# Patient Record
Sex: Female | Born: 1965 | Race: White | Hispanic: No | Marital: Married | Smoking: Never smoker
Health system: Southern US, Community
[De-identification: ages and names within clinical notes are randomized; demographics above are authoritative.]

## PROBLEM LIST (undated history)

## (undated) DIAGNOSIS — D219 Benign neoplasm of connective and other soft tissue, unspecified: Secondary | ICD-10-CM

## (undated) DIAGNOSIS — I10 Essential (primary) hypertension: Secondary | ICD-10-CM

## (undated) HISTORY — PX: OTHER SURGICAL HISTORY: SHX169

---
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
## 1998-03-09 ENCOUNTER — Inpatient Hospital Stay (HOSPITAL_COMMUNITY): Admission: AD | Admit: 1998-03-09 | Discharge: 1998-03-09 | Payer: Self-pay | Admitting: Obstetrics and Gynecology

## 1998-03-25 ENCOUNTER — Observation Stay (HOSPITAL_COMMUNITY): Admission: AD | Admit: 1998-03-25 | Discharge: 1998-03-28 | Payer: Self-pay | Admitting: Obstetrics and Gynecology

## 1998-05-01 ENCOUNTER — Other Ambulatory Visit: Admission: RE | Admit: 1998-05-01 | Discharge: 1998-05-01 | Payer: Self-pay | Admitting: Obstetrics and Gynecology

## 2000-09-01 ENCOUNTER — Other Ambulatory Visit: Admission: RE | Admit: 2000-09-01 | Discharge: 2000-09-01 | Payer: Self-pay | Admitting: Obstetrics and Gynecology

## 2002-02-28 ENCOUNTER — Other Ambulatory Visit: Admission: RE | Admit: 2002-02-28 | Discharge: 2002-02-28 | Payer: Self-pay | Admitting: Obstetrics and Gynecology

## 2008-08-28 ENCOUNTER — Encounter: Admission: RE | Admit: 2008-08-28 | Discharge: 2008-08-28 | Payer: Self-pay | Admitting: Obstetrics and Gynecology

## 2011-12-29 ENCOUNTER — Other Ambulatory Visit: Payer: Self-pay | Admitting: Obstetrics and Gynecology

## 2011-12-29 DIAGNOSIS — Z1231 Encounter for screening mammogram for malignant neoplasm of breast: Secondary | ICD-10-CM

## 2012-01-03 ENCOUNTER — Ambulatory Visit
Admission: RE | Admit: 2012-01-03 | Discharge: 2012-01-03 | Disposition: A | Discharge status: Home / Self Care | Payer: PRIVATE HEALTH INSURANCE | Source: Ambulatory Visit | Attending: Obstetrics and Gynecology | Admitting: Obstetrics and Gynecology

## 2012-01-03 DIAGNOSIS — Z1231 Encounter for screening mammogram for malignant neoplasm of breast: Secondary | ICD-10-CM

## 2013-07-31 ENCOUNTER — Other Ambulatory Visit: Payer: Self-pay

## 2013-07-31 ENCOUNTER — Ambulatory Visit
Admission: RE | Admit: 2013-07-31 | Discharge: 2013-07-31 | Disposition: A | Discharge status: Home / Self Care | Payer: PRIVATE HEALTH INSURANCE | Source: Ambulatory Visit

## 2013-07-31 ENCOUNTER — Encounter (INDEPENDENT_AMBULATORY_CARE_PROVIDER_SITE_OTHER): Payer: Self-pay

## 2013-07-31 DIAGNOSIS — Z1231 Encounter for screening mammogram for malignant neoplasm of breast: Secondary | ICD-10-CM

## 2014-05-07 ENCOUNTER — Other Ambulatory Visit (HOSPITAL_COMMUNITY): Payer: Self-pay | Admitting: Interventional Radiology

## 2014-05-07 DIAGNOSIS — D259 Leiomyoma of uterus, unspecified: Secondary | ICD-10-CM

## 2014-05-13 ENCOUNTER — Other Ambulatory Visit: Payer: Self-pay | Admitting: Obstetrics and Gynecology

## 2014-05-13 ENCOUNTER — Ambulatory Visit
Admission: RE | Admit: 2014-05-13 | Discharge: 2014-05-13 | Disposition: A | Discharge status: Home / Self Care | Payer: 59 | Source: Ambulatory Visit | Attending: Interventional Radiology | Admitting: Interventional Radiology

## 2014-05-13 DIAGNOSIS — D259 Leiomyoma of uterus, unspecified: Secondary | ICD-10-CM

## 2014-05-13 HISTORY — DX: Essential (primary) hypertension: I10

## 2014-05-13 HISTORY — DX: Benign neoplasm of connective and other soft tissue, unspecified: D21.9

## 2014-05-13 NOTE — Consult Note (Signed)
Chief Complaint: Symptomatic uterine fibroid disease.  Referring Physician(s): Taavon,Richard  History of Present Illness: Natalie Carson is a G48,P3 49 y.o. female with a known history of uterine fibroids since approximately July 2015. Her main complaint is menorrhagia with passage of clots. She usually has 2-3 days of very heavy bleeding. She also complains of bulk symptoms with discomfort of the right pelvis when she lies on her stomach and chronic low back pain. She has some urinary urgency without significant increased urinary frequency. Ultrasound has demonstrated a right-sided dominant fibroid measuring approximately 11 x 10 x 10 cm. She was treated with Lupron injections every month since September which resulted in initially slight decrease in size of the dominant fibroid. However, there has been no significant overall shrinkage in the size of the dominant fibroid to allow less invasive hysterectomy options. She was taken off Lupron in January and recently started spotting on 05/03/2014.  Natalie Carson has had 3 children aged 63, 44 and 45 years old. These were all vaginal deliveries. She has not had prior uterine surgery. She denies any history of gynecologic infections.  No past medical history on file.  Past Surgical History  Procedure Laterality Date  . Tonsillectomy  1975  . Hernia repair  1974    Allergies: Review of patient's allergies indicates no known allergies.  Medications: Losartin      Family History  Father's cousin with history of leiomyosarcoma of uterus.  History   Social History  . Marital Status: Married    Spouse Name: N/A  . Number of Children: N/A  . Years of Education: N/A   Social History Main Topics  . Smoking status: Not on file  . Smokeless tobacco: Not on file  . Alcohol Use: Not on file  . Drug Use: Not on file  . Sexual Activity: Not on file   Other Topics Concern  . Not on file   Social History Narrative  . No narrative  on file   Tthe patient works full time at [employer] in the [employer].   Review of Systems: A 12 point ROS discussed and pertinent positives are indicated in the HPI above.  All other systems are negative.  Review of Systems  Constitutional: Negative.   Respiratory: Negative.   Cardiovascular: Negative.   Gastrointestinal: Positive for abdominal distention. Negative for nausea, vomiting, abdominal pain, diarrhea, constipation, blood in stool and anal bleeding.  Genitourinary: Positive for urgency, menstrual problem and pelvic pain. Negative for dysuria, frequency, hematuria, flank pain, decreased urine volume, vaginal discharge, enuresis, difficulty urinating and dyspareunia.  Musculoskeletal: Positive for back pain. Negative for myalgias, joint swelling, arthralgias, gait problem and neck pain.  Neurological: Negative.      Vital Signs: BP 140/92 mmHg  Pulse 81  Ht 5' 4"  (1.626 m)  Wt 195 lb (88.451 kg)  BMI 33.46 kg/m2  LMP 05/03/2014  Physical Exam  Constitutional: She is oriented to person, place, and time. She appears well-developed and well-nourished. No distress.  HENT:  Head: Normocephalic.  Neck: Neck supple. No JVD present.  Cardiovascular: Normal rate, regular rhythm and normal heart sounds.  Exam reveals no friction rub.   No murmur heard. Pulmonary/Chest: Effort normal and breath sounds normal. No respiratory distress. She has no wheezes. She has no rales.  Abdominal: Soft. Bowel sounds are normal. There is no tenderness.  Palpable enlarged uterus on transabdominal exam extending up to the level of the umbilicus. Palpable portion of the uterus and/or dominant palpable  fibroid is to the right of midline.  Musculoskeletal: She exhibits no edema or tenderness.  Lymphadenopathy:    She has no cervical adenopathy.  Neurological: She is alert and oriented to person, place, and time.  Skin: She is not diaphoretic.  Nursing note and vitals  reviewed.   Imaging: Other than the dominant 11 cm subserosal fibroid noted of the right uterus, no other apparent fibroids by office ultrasound. The ovaries were poorly visualized bilaterally.  Labs:  CBC: WBC 3.1, hemoglobin 11.1, hematocrit 35 and platelet count 244 in 2015.  COAGS: No results for input(s): INR, APTT in the last 8760 hours.  BMP: BUN 10, creatinine 1.05 and estimated GFR 64 mL per minute. Sodium 139, potassium 4.4.  LIVER FUNCTION TESTS: Total protein 6.4, albumin 4.0, total bilirubin less than 0.2, alkaline phosphatase 51, AST 12 and ALT 10  Pap smear negative for malignancy. Squamous metaplasia present.   Assessment and Plan:  I met with Natalie Carson and discussed treatment options with her regarding her symptomatic uterine fibroid disease. Based on ultrasound, there appears to be one single dominant subserosal fibroid. There may be other smaller fibroids that may not be detectable by ultrasound. The benefits of hysterectomy versus uterine fibroid embolization were discussed. Technical details of fibroid embolization were reviewed including risks. The patient was also given a pamphlet for additional information. There is an indication to treat based on menorrhagia and bulk symptoms related to uterine enlargement.  In order to determine if Natalie Carson is a candidate for fibroid embolization, MRI of the pelvis will be necessary to evaluate fibroid morphology and enhancement pattern. This will also allow evaluation of the ovaries and adnexal regions which were not well-visualized by ultrasound.  Natalie Carson is agreeable to undergo MRI of the pelvis. She would like to review images with me after the procedure is performed and interpreted. I will meet back with her once the MRI is obtained. If she is a candidate for embolization, Natalie Carson is interested in pursuing embolization to reduce the amount of time off she will have to take from work. She did have some questions  regarding the potential for malignant transformation of a fibroid due to her family history of uterine leiomyosarcoma. I told the patient that this is very rare and that MRI evaluation should help in determining if the dominant fibroid and/or other fibroids have a typical appearance and enhancement pattern.  Thank you for this interesting consult.  I greatly enjoyed meeting Natalie Carson and look forward to participating in their care.  SignedAletta Edouard T 05/13/2014, 2:01 PM     I spent a total of 40 minutes face to face in clinical consultation, greater than 50% of which was counseling/coordinating care for management of symptomatic uterine fibroids.

## 2014-05-27 ENCOUNTER — Ambulatory Visit
Admission: RE | Admit: 2014-05-27 | Discharge: 2014-05-27 | Disposition: A | Discharge status: Home / Self Care | Payer: 59 | Source: Ambulatory Visit | Attending: Obstetrics and Gynecology | Admitting: Obstetrics and Gynecology

## 2014-05-27 DIAGNOSIS — D259 Leiomyoma of uterus, unspecified: Secondary | ICD-10-CM

## 2014-05-27 MED ORDER — GADOBENATE DIMEGLUMINE 529 MG/ML IV SOLN
18.0000 mL | Freq: Once | INTRAVENOUS | Status: AC | PRN
  Administered 2014-05-27: 18 mL via INTRAVENOUS

## 2014-06-03 ENCOUNTER — Other Ambulatory Visit (HOSPITAL_COMMUNITY): Payer: Self-pay | Admitting: Interventional Radiology

## 2014-06-03 DIAGNOSIS — D259 Leiomyoma of uterus, unspecified: Secondary | ICD-10-CM

## 2014-06-10 ENCOUNTER — Other Ambulatory Visit: Payer: 59

## 2014-06-26 ENCOUNTER — Ambulatory Visit
Admission: RE | Admit: 2014-06-26 | Discharge: 2014-06-26 | Disposition: A | Discharge status: Home / Self Care | Payer: 59 | Source: Ambulatory Visit | Attending: Interventional Radiology | Admitting: Interventional Radiology

## 2014-06-26 DIAGNOSIS — D259 Leiomyoma of uterus, unspecified: Secondary | ICD-10-CM

## 2014-07-01 NOTE — Progress Notes (Signed)
   Chief Complaint: Systematic uterine fibroid disease.  History of Present Illness: Natalie Carson is a 49 y.o. female seen on 05/13/2014 for evaluation for possible uterine fibroid embolization. The patient returns for discussion after MRI of the pelvis was performed on 05/27/2014.  Past Medical History  Diagnosis Date  . Hypertension   . Fibroids     Past Surgical History  Procedure Laterality Date  . Tonsillectomy  1975  . Hernia repair  1974    Allergies: Review of patient's allergies indicates no known allergies.  Medications: Prior to Admission medications   Medication Sig Start Date End Date Taking? Authorizing Provider  losartan (COZAAR) 100 MG tablet Take 100 mg by mouth daily.   Yes Historical Provider, MD    No family history on file.  History   Social History  . Marital Status: Married    Spouse Name: N/A  . Number of Children: N/A  . Years of Education: N/A   Social History Main Topics  . Smoking status: Not on file  . Smokeless tobacco: Not on file  . Alcohol Use: Not on file  . Drug Use: Not on file  . Sexual Activity: Not on file   Other Topics Concern  . Not on file   Social History Narrative  . No narrative on file    Review of Systems  Vital Signs: BP 145/80 mmHg  Pulse 97  Temp(Src) 98.6 F (37 C)  Resp 14  SpO2 96%  LMP 06/18/2014  Physical Exam  Imaging: No results found.  Assessment and Plan:  I met with Natalie Carson. We reviewed the MRI of the pelvis performed on 05/27/2014. This shows a large dominant fundal uterine mass measuring 11 x 10 x 13 cm. This fibroid is markedly heterogeneous in signal intensity on precontrast imaging and shows avid enhancement after administration of gadolinium. The mass obscures the endometrium.  Given the lack of shrinkage of this dominant mass with Lupron therapy and MRI characteristics, I told Natalie Carson that I am somewhat concerned that this could represent a malignant fibroid/sarcoma.    I recommended that Natalie Carson meet back with Dr. Ronita Hipps to discuss hysterectomy. Rather than perform embolization now that might ultimately result in hysterectomy for enlarging an enlarging mass, definitive hysterectomy may be the more appropriate procedure. Natalie Carson plans to make an appointment to talk to Dr. Ronita Hipps.   SignedAletta Edouard T 07/01/2014, 9:11 AM     I spent a total of 15 minutes face to face strictly in a counseling role for this visit.

## 2014-07-03 ENCOUNTER — Other Ambulatory Visit: Payer: 59

## 2014-07-04 ENCOUNTER — Other Ambulatory Visit: Payer: 59

## 2014-08-25 ENCOUNTER — Other Ambulatory Visit: Payer: Self-pay | Admitting: Obstetrics and Gynecology

## 2014-08-26 NOTE — Anesthesia Preprocedure Evaluation (Addendum)
Anesthesia Evaluation  Patient identified by MRN, date of birth, ID band Patient awake    Reviewed: Allergy & Precautions, NPO status , Patient's Chart, lab work & pertinent test results, reviewed documented beta blocker date and time   Airway Mallampati: II   Neck ROM: Full    Dental  (+) Teeth Intact, Dental Advisory Given   Pulmonary neg pulmonary ROS,  breath sounds clear to auscultation        Cardiovascular hypertension, Pt. on medications Rhythm:Regular     Neuro/Psych negative neurological ROS  negative psych ROS   GI/Hepatic negative GI ROS, Neg liver ROS,   Endo/Other  negative endocrine ROS  Renal/GU negative Renal ROS     Musculoskeletal   Abdominal (+)  Abdomen: soft.    Peds  Hematology 12/37   Anesthesia Other Findings   Reproductive/Obstetrics                         Anesthesia Physical Anesthesia Plan  ASA: II  Anesthesia Plan: General   Post-op Pain Management:    Induction: Intravenous  Airway Management Planned: Oral ETT  Additional Equipment:   Intra-op Plan:   Post-operative Plan: Extubation in OR  Informed Consent: I have reviewed the patients History and Physical, chart, labs and discussed the procedure including the risks, benefits and alternatives for the proposed anesthesia with the patient or authorized representative who has indicated his/her understanding and acceptance.     Plan Discussed with:   Anesthesia Plan Comments: (Check am labs, multimodal pain rx)       Anesthesia Quick Evaluation

## 2014-08-28 ENCOUNTER — Other Ambulatory Visit: Payer: Self-pay

## 2014-08-28 ENCOUNTER — Encounter (HOSPITAL_COMMUNITY)
Admission: RE | Admit: 2014-08-28 | Discharge: 2014-08-28 | Disposition: A | Discharge status: Home / Self Care | Payer: 59 | Source: Ambulatory Visit | Attending: Obstetrics and Gynecology | Admitting: Obstetrics and Gynecology

## 2014-08-28 ENCOUNTER — Encounter (HOSPITAL_COMMUNITY): Payer: Self-pay

## 2014-08-28 DIAGNOSIS — Z01818 Encounter for other preprocedural examination: Secondary | ICD-10-CM | POA: Diagnosis not present

## 2014-08-28 DIAGNOSIS — D259 Leiomyoma of uterus, unspecified: Secondary | ICD-10-CM | POA: Diagnosis not present

## 2014-08-28 LAB — BASIC METABOLIC PANEL
ANION GAP: 4 — AB (ref 5–15)
BUN: 11 mg/dL (ref 6–20)
CHLORIDE: 106 mmol/L (ref 101–111)
CO2: 27 mmol/L (ref 22–32)
CREATININE: 1.04 mg/dL — AB (ref 0.44–1.00)
Calcium: 9 mg/dL (ref 8.9–10.3)
GFR calc Af Amer: >60 mL/min (ref >60)
GFR calc non Af Amer: >60 mL/min (ref >60)
Glucose, Bld: 94 mg/dL (ref 65–99)
Potassium: 4.5 mmol/L (ref 3.5–5.1)
Sodium: 137 mmol/L (ref 135–145)

## 2014-08-28 LAB — CBC
HEMATOCRIT: 37.7 % (ref 36.0–46.0)
HEMOGLOBIN: 12.4 g/dL (ref 12.0–15.0)
MCH: 29.4 pg (ref 26.0–34.0)
MCHC: 32.9 g/dL (ref 30.0–36.0)
MCV: 89.3 fL (ref 78.0–100.0)
PLATELETS: 215 10*3/uL (ref 150–400)
RBC: 4.22 MIL/uL (ref 3.87–5.11)
RDW: 13 % (ref 11.5–15.5)
WBC: 3.7 10*3/uL — ABNORMAL LOW (ref 4.0–10.5)

## 2014-08-28 LAB — TYPE AND SCREEN
ABO/RH(D): AB POS
ANTIBODY SCREEN: NEGATIVE

## 2014-08-28 LAB — ABO/RH: ABO/RH(D): AB POS

## 2014-08-28 NOTE — Patient Instructions (Addendum)
   Your procedure is scheduled on: July 25 at [facility] through the [place] [facility] at: [facility] up the phone at the desk and dial [phone] and inform us of your arrival.  Please call this number if you have any problems the morning of surgery: [phone]  Remember: Do not eat food after midnight: July 24 (sunday) Do not drink clear liquids after: 9am July 25 day of surgery Take these medicines the morning of surgery with a SIP OF WATER:  COZAAR DAY OF SURGERY  Do not wear jewelry, make-up, No metal in your hair or on your body. Do not wear lotions, powders, perfumes.  You may wear deodorant.  Do not bring valuables to the hospital. Contacts, dentures or bridgework may not be worn into surgery.  Leave suitcase in the car. After Surgery it may be brought to your room. For patients being admitted to the hospital, checkout time is 11:00am the day of discharge.

## 2014-08-31 NOTE — H&P (Signed)
Natalie Carson, Natalie Carson             ACCOUNT NO.:  [id number]  MEDICAL RECORD NO.:  [id number]  LOCATION:  [facility]                         FACILITY:  [facility]  PHYSICIAN:  Lovenia Kim, M.D.DATE OF BIRTH:  01-30-1966  DATE OF ADMISSION:  07/29/2014 DATE OF DISCHARGE:                             HISTORY & PHYSICAL   PREOPERATIVE DIAGNOSES:  Enlarging uterine fibroid with symptomatic pain and history of secondary anemia.  HISTORY OF PRESENT ILLNESS:  She is a 49 year old Caucasian female, G4, P3, with a history of enlarging fibroids for definitive therapy.  She had Lupron with no shrinkage noted.  She was sent by [facility] to Interventional Radiology for consideration of embolization.  Dr. Kathlene Cote decided again due to the enlarging fibroid and questionable concerns for adenosarcoma.  She had a negative endometrial biopsy.  She had normal LDH isoenzymes.  FAMILY HISTORY:  Lung cancer, ovarian cancer, thyroid cancer, diabetes, hypertension, and breast cancer.  SOCIAL HISTORY:  She is a nonsmoker, nondrinker.  She denies domestic or physical violence.  She has a history of vaginal delivery x3, history of cone biopsy in 1994, tonsillectomy and hernia repair.  PHYSICAL EXAMINATION:  GENERAL:  She is a well-developed, well-nourished white female, in no acute distress. HEENT:  Normal. NECK:  Supple.  Full range of motion. LUNGS:  Clear. HEART:  Regular rate and rhythm. ABDOMEN:  Soft, nontender. PELVIC:  Reveals some markedly enlarged uterus to about 16-week size. No adnexal masses were appreciated. EXTREMITIES:  There are no cords. NEUROLOGIC:  Nonfocal. SKIN:  Intact.  IMPRESSION:  Enlarging uterine fibroids refractory to Lupron, not  a candidate for Interventional Radiology embolization.  PLAN:  Due to concerns regarding sarcoma despite negative workup, we will proceed with total abdominal hysterectomy/bilateral salpingectomy instead of a minimally invasive approach. Risks of  anesthesia, infection, bleeding, injury to surrounding organs with possible need for repair was discussed, delayed versus immediate complications including bowel and bladder injury with possible need for repair noted. Inability to predict sarcoma despite negative workup discussed.     Lovenia Kim, M.D.     RJT/MEDQ  D:  08/31/2014  T:  08/31/2014  Job:  [id number]

## 2014-09-01 ENCOUNTER — Inpatient Hospital Stay (HOSPITAL_COMMUNITY)
Admission: RE | Admit: 2014-09-01 | Discharge: 2014-09-03 | DRG: 743 | Disposition: A | Discharge status: Home / Self Care | Payer: 59 | Source: Ambulatory Visit | Attending: Obstetrics and Gynecology | Admitting: Obstetrics and Gynecology

## 2014-09-01 ENCOUNTER — Inpatient Hospital Stay (HOSPITAL_COMMUNITY): Payer: 59 | Admitting: Anesthesiology

## 2014-09-01 ENCOUNTER — Encounter (HOSPITAL_COMMUNITY)
Admission: RE | Disposition: A | Discharge status: Home / Self Care | Payer: Self-pay | Source: Ambulatory Visit | Attending: Obstetrics and Gynecology

## 2014-09-01 ENCOUNTER — Encounter (HOSPITAL_COMMUNITY): Payer: Self-pay | Admitting: *Deleted

## 2014-09-01 DIAGNOSIS — D649 Anemia, unspecified: Secondary | ICD-10-CM | POA: Diagnosis present

## 2014-09-01 DIAGNOSIS — N831 Corpus luteum cyst: Secondary | ICD-10-CM | POA: Diagnosis present

## 2014-09-01 DIAGNOSIS — Z833 Family history of diabetes mellitus: Secondary | ICD-10-CM | POA: Diagnosis not present

## 2014-09-01 DIAGNOSIS — N921 Excessive and frequent menstruation with irregular cycle: Secondary | ICD-10-CM | POA: Diagnosis present

## 2014-09-01 DIAGNOSIS — Z801 Family history of malignant neoplasm of trachea, bronchus and lung: Secondary | ICD-10-CM

## 2014-09-01 DIAGNOSIS — Z8041 Family history of malignant neoplasm of ovary: Secondary | ICD-10-CM

## 2014-09-01 DIAGNOSIS — N736 Female pelvic peritoneal adhesions (postinfective): Secondary | ICD-10-CM | POA: Diagnosis present

## 2014-09-01 DIAGNOSIS — Z8249 Family history of ischemic heart disease and other diseases of the circulatory system: Secondary | ICD-10-CM | POA: Diagnosis not present

## 2014-09-01 DIAGNOSIS — Z803 Family history of malignant neoplasm of breast: Secondary | ICD-10-CM

## 2014-09-01 DIAGNOSIS — N838 Other noninflammatory disorders of ovary, fallopian tube and broad ligament: Secondary | ICD-10-CM | POA: Diagnosis present

## 2014-09-01 DIAGNOSIS — N946 Dysmenorrhea, unspecified: Secondary | ICD-10-CM | POA: Diagnosis present

## 2014-09-01 DIAGNOSIS — D259 Leiomyoma of uterus, unspecified: Principal | ICD-10-CM | POA: Diagnosis present

## 2014-09-01 DIAGNOSIS — I10 Essential (primary) hypertension: Secondary | ICD-10-CM | POA: Diagnosis present

## 2014-09-01 DIAGNOSIS — D219 Benign neoplasm of connective and other soft tissue, unspecified: Secondary | ICD-10-CM | POA: Diagnosis present

## 2014-09-01 DIAGNOSIS — N830 Follicular cyst of ovary: Secondary | ICD-10-CM | POA: Diagnosis present

## 2014-09-01 HISTORY — PX: BILATERAL SALPINGECTOMY: SHX5743

## 2014-09-01 HISTORY — PX: ABDOMINAL HYSTERECTOMY: SHX81

## 2014-09-01 SURGERY — HYSTERECTOMY, ABDOMINAL
Anesthesia: General | Site: Abdomen

## 2014-09-01 MED ORDER — PROMETHAZINE HCL 25 MG/ML IJ SOLN
6.2500 mg | INTRAMUSCULAR | Status: DC | PRN
Start: 2014-09-01 — End: 2014-09-01

## 2014-09-01 MED ORDER — PHENYLEPHRINE 40 MCG/ML (10ML) SYRINGE FOR IV PUSH (FOR BLOOD PRESSURE SUPPORT)
PREFILLED_SYRINGE | INTRAVENOUS | Status: AC
  Filled 2014-09-01: qty 10

## 2014-09-01 MED ORDER — MIDAZOLAM HCL 2 MG/2ML IJ SOLN
INTRAMUSCULAR | Status: DC | PRN
Start: 2014-09-01 — End: 2014-09-01
  Administered 2014-09-01: 2 mg via INTRAVENOUS

## 2014-09-01 MED ORDER — HYDROMORPHONE 0.3 MG/ML IV SOLN
INTRAVENOUS | Status: DC
  Administered 2014-09-01: 18:00:00 via INTRAVENOUS
  Administered 2014-09-01: 3.3 mg via INTRAVENOUS
  Administered 2014-09-02 (×3): 1.2 mg via INTRAVENOUS
  Filled 2014-09-01: qty 25

## 2014-09-01 MED ORDER — BUPIVACAINE HCL (PF) 0.25 % IJ SOLN
INTRAMUSCULAR | Status: AC
  Filled 2014-09-01: qty 10

## 2014-09-01 MED ORDER — ONDANSETRON HCL 4 MG/2ML IJ SOLN
INTRAMUSCULAR | Status: DC | PRN
  Administered 2014-09-01: 4 mg via INTRAVENOUS

## 2014-09-01 MED ORDER — PHENYLEPHRINE HCL 10 MG/ML IJ SOLN
INTRAMUSCULAR | Status: DC | PRN
  Administered 2014-09-01 (×2): 40 ug via INTRAVENOUS

## 2014-09-01 MED ORDER — DEXAMETHASONE SODIUM PHOSPHATE 10 MG/ML IJ SOLN
INTRAMUSCULAR | Status: DC | PRN
  Administered 2014-09-01: 8 mg via INTRAVENOUS

## 2014-09-01 MED ORDER — OXYCODONE-ACETAMINOPHEN 5-325 MG PO TABS
1.0000 | ORAL_TABLET | ORAL | Status: DC | PRN
  Administered 2014-09-02: 1 via ORAL
  Administered 2014-09-02 – 2014-09-03 (×8): 2 via ORAL
  Filled 2014-09-01 (×2): qty 1
  Filled 2014-09-01 (×4): qty 2
  Filled 2014-09-01: qty 1
  Filled 2014-09-01 (×3): qty 2

## 2014-09-01 MED ORDER — DEXAMETHASONE SODIUM PHOSPHATE 10 MG/ML IJ SOLN
INTRAMUSCULAR | Status: AC
  Filled 2014-09-01: qty 1

## 2014-09-01 MED ORDER — HYDROMORPHONE HCL 1 MG/ML IJ SOLN
0.2500 mg | INTRAMUSCULAR | Status: DC | PRN
  Administered 2014-09-01: 0.25 mg via INTRAVENOUS
  Administered 2014-09-01: 0.5 mg via INTRAVENOUS
  Administered 2014-09-01: 0.25 mg via INTRAVENOUS

## 2014-09-01 MED ORDER — LIDOCAINE HCL (CARDIAC) 20 MG/ML IV SOLN
INTRAVENOUS | Status: DC | PRN
  Administered 2014-09-01: 100 mg via INTRAVENOUS

## 2014-09-01 MED ORDER — DIPHENHYDRAMINE HCL 50 MG/ML IJ SOLN: 12.5000 mg | Freq: Four times a day (QID) | INTRAMUSCULAR | Status: DC | PRN

## 2014-09-01 MED ORDER — PROPOFOL 10 MG/ML IV BOLUS
INTRAVENOUS | Status: AC
  Filled 2014-09-01: qty 20

## 2014-09-01 MED ORDER — FENTANYL CITRATE (PF) 250 MCG/5ML IJ SOLN
INTRAMUSCULAR | Status: AC
  Filled 2014-09-01: qty 5

## 2014-09-01 MED ORDER — GLYCOPYRROLATE 0.2 MG/ML IJ SOLN
INTRAMUSCULAR | Status: DC | PRN
  Administered 2014-09-01: .1 mg via INTRAVENOUS
  Administered 2014-09-01: .6 mg via INTRAVENOUS

## 2014-09-01 MED ORDER — KETOROLAC TROMETHAMINE 30 MG/ML IJ SOLN
INTRAMUSCULAR | Status: AC
  Filled 2014-09-01: qty 1

## 2014-09-01 MED ORDER — BUPIVACAINE HCL (PF) 0.25 % IJ SOLN
INTRAMUSCULAR | Status: DC | PRN
  Administered 2014-09-01: 10 mL

## 2014-09-01 MED ORDER — PROPOFOL 10 MG/ML IV BOLUS
INTRAVENOUS | Status: DC | PRN
  Administered 2014-09-01: 200 mg via INTRAVENOUS

## 2014-09-01 MED ORDER — LIDOCAINE HCL (CARDIAC) 20 MG/ML IV SOLN
INTRAVENOUS | Status: AC
  Filled 2014-09-01: qty 5

## 2014-09-01 MED ORDER — TRAMADOL HCL 50 MG PO TABS: 50.0000 mg | ORAL_TABLET | Freq: Four times a day (QID) | ORAL | Status: DC | PRN

## 2014-09-01 MED ORDER — NEOSTIGMINE METHYLSULFATE 10 MG/10ML IV SOLN
INTRAVENOUS | Status: AC
  Filled 2014-09-01: qty 1

## 2014-09-01 MED ORDER — DEXTROSE IN LACTATED RINGERS 5 % IV SOLN
INTRAVENOUS | Status: DC
  Administered 2014-09-01 – 2014-09-02 (×2): via INTRAVENOUS

## 2014-09-01 MED ORDER — PROMETHAZINE HCL 25 MG/ML IJ SOLN: 6.2500 mg | INTRAMUSCULAR | Status: DC | PRN

## 2014-09-01 MED ORDER — SODIUM CHLORIDE 0.9 % IJ SOLN: 9.0000 mL | INTRAMUSCULAR | Status: DC | PRN

## 2014-09-01 MED ORDER — KETOROLAC TROMETHAMINE 30 MG/ML IJ SOLN
INTRAMUSCULAR | Status: DC | PRN
  Administered 2014-09-01: 30 mg via INTRAVENOUS

## 2014-09-01 MED ORDER — HYDROMORPHONE HCL 1 MG/ML IJ SOLN
INTRAMUSCULAR | Status: AC
  Filled 2014-09-01: qty 1

## 2014-09-01 MED ORDER — MEPERIDINE HCL 25 MG/ML IJ SOLN: 6.2500 mg | INTRAMUSCULAR | Status: DC | PRN

## 2014-09-01 MED ORDER — EPHEDRINE 5 MG/ML INJ
INTRAVENOUS | Status: AC
  Filled 2014-09-01: qty 10

## 2014-09-01 MED ORDER — DIPHENHYDRAMINE HCL 12.5 MG/5ML PO ELIX: 12.5000 mg | ORAL_SOLUTION | Freq: Four times a day (QID) | ORAL | Status: DC | PRN

## 2014-09-01 MED ORDER — FENTANYL CITRATE (PF) 100 MCG/2ML IJ SOLN
INTRAMUSCULAR | Status: DC | PRN
  Administered 2014-09-01 (×9): 50 ug via INTRAVENOUS

## 2014-09-01 MED ORDER — CEFAZOLIN SODIUM-DEXTROSE 2-3 GM-% IV SOLR
2.0000 g | INTRAVENOUS | Status: AC
  Administered 2014-09-01: 2 g via INTRAVENOUS

## 2014-09-01 MED ORDER — LOSARTAN POTASSIUM 50 MG PO TABS
100.0000 mg | ORAL_TABLET | Freq: Every day | ORAL | Status: DC
Start: 2014-09-02 — End: 2014-09-03
  Administered 2014-09-02 – 2014-09-03 (×2): 100 mg via ORAL
  Filled 2014-09-01 (×3): qty 2

## 2014-09-01 MED ORDER — BUPIVACAINE LIPOSOME 1.3 % IJ SUSP
20.0000 mL | Freq: Once | INTRAMUSCULAR | Status: AC
  Administered 2014-09-01: 20 mL
  Filled 2014-09-01: qty 20

## 2014-09-01 MED ORDER — MIDAZOLAM HCL 2 MG/2ML IJ SOLN
INTRAMUSCULAR | Status: AC
  Filled 2014-09-01: qty 2

## 2014-09-01 MED ORDER — LACTATED RINGERS IV SOLN
INTRAVENOUS | Status: DC
  Administered 2014-09-01 (×3): via INTRAVENOUS

## 2014-09-01 MED ORDER — ONDANSETRON HCL 4 MG/2ML IJ SOLN: 4.0000 mg | Freq: Four times a day (QID) | INTRAMUSCULAR | Status: DC | PRN

## 2014-09-01 MED ORDER — GLYCOPYRROLATE 0.2 MG/ML IJ SOLN
INTRAMUSCULAR | Status: AC
  Filled 2014-09-01: qty 1

## 2014-09-01 MED ORDER — GLYCOPYRROLATE 0.2 MG/ML IJ SOLN
INTRAMUSCULAR | Status: AC
  Filled 2014-09-01: qty 3

## 2014-09-01 MED ORDER — NALOXONE HCL 0.4 MG/ML IJ SOLN: 0.4000 mg | INTRAMUSCULAR | Status: DC | PRN

## 2014-09-01 MED ORDER — ONDANSETRON HCL 4 MG/2ML IJ SOLN
INTRAMUSCULAR | Status: AC
  Filled 2014-09-01: qty 2

## 2014-09-01 MED ORDER — NEOSTIGMINE METHYLSULFATE 10 MG/10ML IV SOLN
INTRAVENOUS | Status: DC | PRN
  Administered 2014-09-01: 3 mg via INTRAVENOUS

## 2014-09-01 MED ORDER — ROCURONIUM BROMIDE 100 MG/10ML IV SOLN
INTRAVENOUS | Status: DC | PRN
  Administered 2014-09-01: 5 mg via INTRAVENOUS
  Administered 2014-09-01: 10 mg via INTRAVENOUS
  Administered 2014-09-01: 30 mg via INTRAVENOUS
  Administered 2014-09-01: 10 mg via INTRAVENOUS
  Administered 2014-09-01: 5 mg via INTRAVENOUS

## 2014-09-01 MED ORDER — 0.9 % SODIUM CHLORIDE (POUR BTL) OPTIME
TOPICAL | Status: DC | PRN
  Administered 2014-09-01: 2000 mL
  Administered 2014-09-01: 1000 mL

## 2014-09-01 MED ORDER — SCOPOLAMINE 1 MG/3DAYS TD PT72
MEDICATED_PATCH | TRANSDERMAL | Status: AC
  Administered 2014-09-01: 1.5 mg via TRANSDERMAL
  Filled 2014-09-01: qty 1

## 2014-09-01 MED ORDER — SCOPOLAMINE 1 MG/3DAYS TD PT72
1.0000 | MEDICATED_PATCH | Freq: Once | TRANSDERMAL | Status: DC
  Administered 2014-09-01: 1.5 mg via TRANSDERMAL

## 2014-09-01 MED ORDER — ROCURONIUM BROMIDE 100 MG/10ML IV SOLN
INTRAVENOUS | Status: AC
  Filled 2014-09-01: qty 1

## 2014-09-01 MED ORDER — EPHEDRINE SULFATE 50 MG/ML IJ SOLN
INTRAMUSCULAR | Status: DC | PRN
  Administered 2014-09-01 (×2): 5 mg via INTRAVENOUS

## 2014-09-01 MED ORDER — CEFAZOLIN SODIUM-DEXTROSE 2-3 GM-% IV SOLR
INTRAVENOUS | Status: AC
  Filled 2014-09-01: qty 50

## 2014-09-01 SURGICAL SUPPLY — 38 items
BENZOIN TINCTURE PRP APPL 2/3 (GAUZE/BANDAGES/DRESSINGS) ×3 IMPLANT
CANISTER SUCT 3000ML (MISCELLANEOUS) ×3 IMPLANT
CLOTH BEACON ORANGE TIMEOUT ST (SAFETY) ×3 IMPLANT
CONT PATH 16OZ SNAP LID 3702 (MISCELLANEOUS) ×3 IMPLANT
DRAPE WARM FLUID 44X44 (DRAPE) ×3 IMPLANT
DRSG OPSITE POSTOP 4X10 (GAUZE/BANDAGES/DRESSINGS) ×3 IMPLANT
DURAPREP 26ML APPLICATOR (WOUND CARE) ×3 IMPLANT
ELECT LIGASURE SHORT 9 REUSE (ELECTRODE) ×3 IMPLANT
GAUZE SPONGE 4X4 16PLY XRAY LF (GAUZE/BANDAGES/DRESSINGS) ×3 IMPLANT
GLOVE BIO SURGEON STRL SZ7.5 (GLOVE) ×3 IMPLANT
GLOVE BIOGEL PI IND STRL 7.0 (GLOVE) ×4 IMPLANT
GLOVE BIOGEL PI INDICATOR 7.0 (GLOVE) ×2
GOWN STRL REUS W/TWL LRG LVL3 (GOWN DISPOSABLE) ×3 IMPLANT
NEEDLE HYPO 22GX1.5 SAFETY (NEEDLE) ×3 IMPLANT
NS IRRIG 1000ML POUR BTL (IV SOLUTION) ×3 IMPLANT
PACK ABDOMINAL GYN (CUSTOM PROCEDURE TRAY) ×3 IMPLANT
PAD OB MATERNITY 4.3X12.25 (PERSONAL CARE ITEMS) ×3 IMPLANT
PROTECTOR NERVE ULNAR (MISCELLANEOUS) ×3 IMPLANT
SPONGE LAP 18X18 X RAY DECT (DISPOSABLE) ×6 IMPLANT
STAPLER VISISTAT 35W (STAPLE) ×3 IMPLANT
STRIP CLOSURE SKIN 1/2X4 (GAUZE/BANDAGES/DRESSINGS) ×3 IMPLANT
SUT MNCRL AB 3-0 PS2 27 (SUTURE) ×3 IMPLANT
SUT MON AB 2-0 CT1 27 (SUTURE) ×3 IMPLANT
SUT MON AB-0 CT1 36 (SUTURE) ×6 IMPLANT
SUT PLAIN 2 0 XLH (SUTURE) IMPLANT
SUT PROLENE 0 CT 1 30 (SUTURE) IMPLANT
SUT VIC AB 0 CT1 18XCR BRD8 (SUTURE) ×6 IMPLANT
SUT VIC AB 0 CT1 27 (SUTURE) ×6
SUT VIC AB 0 CT1 27XBRD ANBCTR (SUTURE) ×12 IMPLANT
SUT VIC AB 0 CT1 8-18 (SUTURE) ×3
SUT VIC AB 3-0 PS1 18 (SUTURE)
SUT VIC AB 3-0 PS1 18X BRD (SUTURE) IMPLANT
SUT VIC AB 4-0 KS 27 (SUTURE) IMPLANT
SUT VICRYL 0 TIES 12 18 (SUTURE) ×6 IMPLANT
SYR CONTROL 10ML LL (SYRINGE) ×3 IMPLANT
TOWEL OR 17X24 6PK STRL BLUE (TOWEL DISPOSABLE) ×6 IMPLANT
TRAY FOLEY CATH SILVER 14FR (SET/KITS/TRAYS/PACK) ×3 IMPLANT
WATER STERILE IRR 1000ML POUR (IV SOLUTION) ×3 IMPLANT

## 2014-09-01 NOTE — Transfer of Care (Signed)
Immediate Anesthesia Transfer of Care Note  Patient: Natalie Carson  Procedure(s) Performed: Procedure(s) with comments: HYSTERECTOMY ABDOMINAL (N/A) - 2 hrs. BILATERAL SALPINGECTOMY (Bilateral)  Patient Location: PACU  Anesthesia Type:General  Level of Consciousness: awake, alert , oriented and patient cooperative  Airway & Oxygen Therapy: Patient Spontanous Breathing and Patient connected to nasal cannula oxygen  Post-op Assessment: Report given to RN and Post -op Vital signs reviewed and stable  Post vital signs: Reviewed and stable  Last Vitals:  Filed Vitals:   09/01/14 1159  BP: 152/87  Pulse: 95  Temp: 36.9 C  Resp: 20    Complications: No apparent anesthesia complications

## 2014-09-01 NOTE — Anesthesia Procedure Notes (Signed)
Procedure Name: Intubation Date/Time: 09/01/2014 1:09 PM Performed by: Georgeanne Nim Pre-anesthesia Checklist: Patient identified, Patient being monitored, Timeout performed, Emergency Drugs available and Suction available Patient Re-evaluated:Patient Re-evaluated prior to inductionOxygen Delivery Method: Circle system utilized Preoxygenation: Pre-oxygenation with 100% oxygen Intubation Type: IV induction Ventilation: Mask ventilation without difficulty and Oral airway inserted - appropriate to patient size Laryngoscope Size: Mac and 3 Grade View: Grade II Tube type: Oral Number of attempts: 1 Airway Equipment and Method: Patient positioned with wedge pillow Placement Confirmation: ETT inserted through vocal cords under direct vision,  positive ETCO2,  CO2 detector and breath sounds checked- equal and bilateral Secured at: 20 cm Tube secured with: Tape Dental Injury: Teeth and Oropharynx as per pre-operative assessment

## 2014-09-01 NOTE — Op Note (Signed)
09/01/2014  3:41 PM  PATIENT:  Reymundo Poll  49 y.o. female  PRE-OPERATIVE DIAGNOSIS:  Uterine Fibroids  POST-OPERATIVE DIAGNOSIS:  Uterine Fibroids  PROCEDURE:  Procedure(s): HYSTERECTOMY ABDOMINAL LEFT SALPINGECTOMY\ RIGHT SALPINGO-OOPHERECTOMY LYSIS OF ADNEXAL ADHESIONS MCCALL CUL DE PLASTY  SURGEON:  Surgeon(s): Brien Few, MD Azucena Fallen, MD  ASSISTANTS: MODY, MD   ANESTHESIA:   local and general  ESTIMATED BLOOD LOSS: 200  DRAINS: Urinary Catheter (Foley)   LOCAL MEDICATIONS USED:  MARCAINE    and Amount: 30 ml  SPECIMEN:  Source of Specimen:  UTERUS, CERVIX, FIBROID, LEFT TUBE, RIGHT TUBE, RIGHT OVARY  DISPOSITION OF SPECIMEN:  PATHOLOGY  COUNTS:  YES  DICTATION #: [id number]  PLAN OF CARE: ADMIT  PATIENT DISPOSITION:  PACU - hemodynamically stable.

## 2014-09-01 NOTE — Op Note (Signed)
[name], Carson             ACCOUNT NO.:  [id number]  MEDICAL RECORD NO.:  [id number]  LOCATION:  [facility]                          FACILITY:  [facility]  PHYSICIAN:  Lovenia Kim, M.D.DATE OF BIRTH:  Jan 30, 1966  DATE OF PROCEDURE:  09/01/2014 DATE OF DISCHARGE:                              OPERATIVE REPORT   PREOPERATIVE DIAGNOSES:  Menometrorrhagia with secondary anemia, enlarging uterine fibroids, dysmenorrhea and pelvic pain.  POSTOPERATIVE DIAGNOSES:  Menometrorrhagia with secondary anemia, enlarging uterine fibroids, dysmenorrhea, pelvic pain, and enterocele pelvic adhesions.  PROCEDURE:  Total abdominal hysterectomy, right salpingo-oophorectomy, left salpingectomy, lysis of adnexal adhesions, and McCall culdoplasty.  SURGEON:  Lovenia Kim, M.D.  ASSISTANT:  Dr. Lisbeth Renshaw.  ANESTHESIA:  General.  ESTIMATED BLOOD LOSS:  200 mL.  COMPLICATIONS:  None.  DRAINS:  Foley.  COUNTS:  Correct.  DISPOSITION:  The patient to recovery in good condition.  BRIEF OPERATIVE NOTE:  After being apprised of the risks of anesthesia, infection, bleeding, injury to surrounding organs, possible need for repair, delayed versus immediate complications to include bowel and bladder injury, possible need for repair, the patient was brought to the operating room where she was administered general anesthetic without complications.  Prepped and draped in the usual sterile fashion. Catheterized until the bladder was empty and Foley catheter left indwelling.  At this time, exam under anesthesia reveals an 18-week mobile uterus deviated to the right, incision was made to proceed with a Pfannenstiel skin incision after the patient was prepped and draped in the usual sterile fashion.  A generous Pfannenstiel skin incision was made with a scalpel, carried down to the fascia, which was nicked in the midline and opened transversely using Mayo scissors.  Rectus muscles were dissected sharply in  the midline.  Peritoneum entered sharply. Normal omentum, normal bowel, normal liver and gallbladder bed, and bilateral normal ovaries noted.  The fibroid is very vascular in its appearance.  It extends up into the right adnexa and it is intimately adherent to the right ovary.  Due to the decision to not interrupt the fibroid due to concerns about the sarcoma, the specimen was elevated out of the field.  The right round ligament is elevated, cauterized with a LigaSure and divided, retroperitoneal space was entered.  The adhesions to the right adnexa are lysed in a sharp and blunt fashion, freeing the total right adnexa due to the adherence of the right ovary to the specimen.  Of note, a decision was made to remove the right ovary.  The infundibulopelvic ligament is skeletonized and divided using the LigaSure and the pedicle is divided.  At this time, the broad ligament is further developed along the posterior aspect of the uterus and the uterine vessels were skeletonized.  Bladder flap was developed sharply. Uterine vessels were cauterized using the LigaSure, but not cut.  On the left side, there is a normal-appearing left ovary.  The left ovarian ligament was cauterized and cut.  The round ligament was cauterized and divided, retroperitoneal space was entered.  Uterine vessels skeletonized and the left bladder flap further developed along the cervicovaginal junction.  The uterine vessels were cauterized and divided using the LigaSure.  At this time, straight  Haney clamps were used to take progressive bites along the cardinal ligament down along the posterior aspect of the cervix.  The specimen is amputated at the level of the mid cervix and the uterus is sent off intact.  The uterosacral ligaments were then clamped and divided and held.  The cervicovaginal junction was identified, bladder flap was further extended and the vaginal opening is identified using curved Haney clamps.  The  cervix was removed.  Transfixion sutures were placed bilaterally, McCall culdoplasty sutures placed using an #0 Vicryl suture.  The uterosacral ligaments were plicated in the midline and plicated at both angles.  The vagina was closed using interrupted sutures, side-to-side irrigation was accomplished.  Good hemostasis was noted.  No evidence of further cul-de-sac adhesions were noted.  McCall culdoplasty suture well placed.  Ureters appear normal bilaterally.  The peritoneum was then closed using a 2-0 Monocryl suture.  Fascia closed using #0 Monocryl.  Subcutaneous tissue reapproximated using a 2-0 plain in a running fashion.  The skin was closed using a 3-0 Monocryl in a continuous running fashion.  Exparel solution was placed and pressure dressing was placed.  The patient tolerated the procedure well, was awakened and transferred to recovery in good condition.     Lovenia Kim, M.D.     RJT/MEDQ  D:  09/01/2014  T:  09/01/2014  Job:  [id number]

## 2014-09-01 NOTE — Anesthesia Postprocedure Evaluation (Signed)
  Anesthesia Post-op Note  Patient: Natalie Carson  Procedure(s) Performed: Procedure(s) with comments: HYSTERECTOMY ABDOMINAL (N/A) - 2 hrs. BILATERAL SALPINGECTOMY (Bilateral) Patient is awake and responsive. Pain and nausea are reasonably well controlled. Vital signs are stable and clinically acceptable. Oxygen saturation is clinically acceptable. There are no apparent anesthetic complications at this time. Patient is ready for discharge.

## 2014-09-01 NOTE — Progress Notes (Signed)
Patient ID: Natalie Carson, female   DOB: 12-21-1965, 49 y.o.   MRN: [id number] Patient seen and examined. Consent witnessed and signed. No changes noted. Update completed.

## 2014-09-02 ENCOUNTER — Encounter (HOSPITAL_COMMUNITY): Payer: Self-pay | Admitting: Obstetrics and Gynecology

## 2014-09-02 LAB — BASIC METABOLIC PANEL
Anion gap: 2 — ABNORMAL LOW (ref 5–15)
BUN: 7 mg/dL (ref 6–20)
CHLORIDE: 105 mmol/L (ref 101–111)
CO2: 28 mmol/L (ref 22–32)
CREATININE: 0.93 mg/dL (ref 0.44–1.00)
Calcium: 8.2 mg/dL — ABNORMAL LOW (ref 8.9–10.3)
GFR calc non Af Amer: >60 mL/min (ref >60)
Glucose, Bld: 139 mg/dL — ABNORMAL HIGH (ref 65–99)
POTASSIUM: 4.4 mmol/L (ref 3.5–5.1)
Sodium: 135 mmol/L (ref 135–145)

## 2014-09-02 LAB — CBC
HCT: 31.5 % — ABNORMAL LOW (ref 36.0–46.0)
HEMOGLOBIN: 10.3 g/dL — AB (ref 12.0–15.0)
MCH: 29.6 pg (ref 26.0–34.0)
MCHC: 32.7 g/dL (ref 30.0–36.0)
MCV: 90.5 fL (ref 78.0–100.0)
Platelets: 207 10*3/uL (ref 150–400)
RBC: 3.48 MIL/uL — ABNORMAL LOW (ref 3.87–5.11)
RDW: 13.2 % (ref 11.5–15.5)
WBC: 10 10*3/uL (ref 4.0–10.5)

## 2014-09-02 NOTE — Progress Notes (Signed)
1 Day Post-Op Procedure(s) (LRB): HYSTERECTOMY ABDOMINAL (N/A) BILATERAL SALPINGECTOMY (Bilateral)  Subjective: Patient reports nausea, incisional pain, tolerating PO, + flatus and no problems voiding.    Objective: I have reviewed patient's vital signs, medications and labs. BP 118/66 mmHg  Pulse 78  Temp(Src) 98.7 F (37.1 C) (Oral)  Resp 15  Ht 5\' 4"  (1.626 m)  Wt 90.719 kg (200 lb)  BMI 34.31 kg/m2  SpO2 99%  CBC    Component Value Date/Time   WBC 10.0 09/02/2014 0507   RBC 3.48* 09/02/2014 0507   HGB 10.3* 09/02/2014 0507   HCT 31.5* 09/02/2014 0507   PLT 207 09/02/2014 0507   MCV 90.5 09/02/2014 0507   MCH 29.6 09/02/2014 0507   MCHC 32.7 09/02/2014 0507   RDW 13.2 09/02/2014 0507      General: alert, cooperative and appears stated age Resp: clear to auscultation bilaterally and normal percussion bilaterally Cardio: regular rate and rhythm, S1, S2 normal, no murmur, click, rub or gallop and normal apical impulse GI: soft, non-tender; bowel sounds normal; no masses,  no organomegaly, normal findings: aorta normal and bowel sounds normal and incision: clean, dry and intact Extremities: extremities normal, atraumatic, no cyanosis or edema and Homans sign is negative, no sign of DVT Vaginal Bleeding: minimal  Assessment: s/p Procedure(s) with comments: HYSTERECTOMY ABDOMINAL (N/A) - 2 hrs. BILATERAL SALPINGECTOMY (Bilateral): stable, progressing well and tolerating diet  Plan: Advance diet Encourage ambulation Advance to PO medication Discontinue IV fluids  LOS: 1 day    Rion Schnitzer J 09/02/2014, 7:16 AM

## 2014-09-02 NOTE — Anesthesia Postprocedure Evaluation (Signed)
  Anesthesia Post-op Note  Patient: Natalie Carson  Procedure(s) Performed: Procedure(s) with comments: HYSTERECTOMY ABDOMINAL (N/A) - 2 hrs. BILATERAL SALPINGECTOMY (Bilateral)  Patient Location: [facility]  Anesthesia Type:General  Level of Consciousness: awake, alert  and oriented  Airway and Oxygen Therapy: Patient Spontanous Breathing and Patient connected to nasal cannula oxygen  Post-op Pain: none  Post-op Assessment: Post-op Vital signs reviewed and Patient's Cardiovascular Status Stable              Post-op Vital Signs: Reviewed and stable  Last Vitals:  Filed Vitals:   09/02/14 1400  BP: 106/62  Pulse: 68  Temp: 37.3 C  Resp: 16    Complications: No apparent anesthesia complications

## 2014-09-02 NOTE — Addendum Note (Signed)
Addendum  created 09/02/14 1527 by Hewitt Blade, CRNA   Modules edited: Notes Section   Notes Section:  File: [id number]

## 2014-09-03 MED ORDER — OXYCODONE-ACETAMINOPHEN 5-325 MG PO TABS: 1.0000 | ORAL_TABLET | ORAL | Status: AC | PRN

## 2014-09-03 MED ORDER — TRAMADOL HCL 50 MG PO TABS: 50.0000 mg | ORAL_TABLET | Freq: Four times a day (QID) | ORAL | Status: AC | PRN

## 2014-09-03 NOTE — Progress Notes (Signed)
Ambulated out teacming complete

## 2014-09-03 NOTE — Progress Notes (Signed)
2 Days Post-Op Procedure(s) (LRB): HYSTERECTOMY ABDOMINAL (N/A) BILATERAL SALPINGECTOMY (Bilateral)  Subjective: Patient reports nausea, incisional pain, tolerating PO, + flatus and no problems voiding.    Objective: I have reviewed patient's vital signs, medications and labs. BP 111/76 mmHg  Pulse 84  Temp(Src) 98.5 F (36.9 C) (Oral)  Resp 16  Ht 5\' 4"  (1.626 m)  Wt 90.719 kg (200 lb)  BMI 34.31 kg/m2  SpO2 100%  CBC    Component Value Date/Time   WBC 10.0 09/02/2014 0507   RBC 3.48* 09/02/2014 0507   HGB 10.3* 09/02/2014 0507   HCT 31.5* 09/02/2014 0507   PLT 207 09/02/2014 0507   MCV 90.5 09/02/2014 0507   MCH 29.6 09/02/2014 0507   MCHC 32.7 09/02/2014 0507   RDW 13.2 09/02/2014 0507      General: alert, cooperative and appears stated age Resp: clear to auscultation bilaterally and normal percussion bilaterally Cardio: regular rate and rhythm, S1, S2 normal, no murmur, click, rub or gallop and normal apical impulse GI: soft, non-tender; bowel sounds normal; no masses,  no organomegaly, normal findings: aorta normal and bowel sounds normal and incision: clean, dry and intact Extremities: extremities normal, atraumatic, no cyanosis or edema and Homans sign is negative, no sign of DVT Vaginal Bleeding: minimal  Assessment: s/p Procedure(s) with comments: HYSTERECTOMY ABDOMINAL (N/A) - 2 hrs. BILATERAL SALPINGECTOMY (Bilateral): stable, progressing well and tolerating diet  Plan: Advance diet Encourage ambulation Advance to PO medication Discontinue IV fluids  DC home today  LOS: 2 days    Natalie Carson J 09/03/2014, 7:59 AM

## 2014-09-04 NOTE — Discharge Summary (Signed)
[name], Natalie Carson             ACCOUNT NO.:  [id number]  MEDICAL RECORD NO.:  [id number]  LOCATION:  9305                          FACILITY:  [facility]  PHYSICIAN:  Lovenia Kim, M.D.DATE OF BIRTH:  1965/08/12  DATE OF ADMISSION:  09/01/2014 DATE OF DISCHARGE:  09/03/2014                              DISCHARGE SUMMARY   The patient underwent uncomplicated TAH-RSO, left salpingectomy on September 02, 2014.  Postoperative pain relief was well controlled, tolerated regular diet well.  Ambulated without difficulty.  Postoperative hematocrit 10.3, preoperative 12.  No signs and symptoms of anemia.  She was discharged to home on postop day #2, on a regular diet.  Discharge medications include Percocet as needed, Ultram as needed.  She is to follow up in the office in 4-6 weeks.  Discharge teaching is done. Pathology results to be discussed directly with the patient upon obtaining those results.     Lovenia Kim, M.D.     RJT/MEDQ  D:  09/03/2014  T:  09/04/2014  Job:  [id number]

## 2016-01-16 IMAGING — MR MR PELVIS WO/W CM
7 of 10 series · 32 of 48 positions shown · IV contrast (multihance)
Comparison: None.

CLINICAL DATA: Subsequent encounter for abnormal uterine bleeding
with uterine fibroids identified on recent ultrasound exam.
Preoperative assessment for uterine artery embolization.

EXAM:
MRI PELVIS WITHOUT AND WITH CONTRAST
TECHNIQUE: Multiplanar multisequence MR imaging of the pelvis was performed
both before and after administration of intravenous contrast.
CONTRAST:  18mL MULTIHANCE GADOBENATE DIMEGLUMINE 529 MG/ML IV SOLN

[Series 3: T2 · coronal · 6.0mm · 1.41mm/px · 4 of 28 slices shown]
[im 1/28]
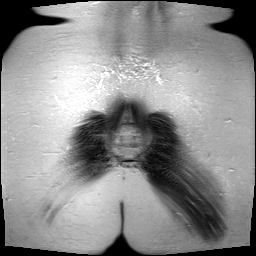
[im 10/28]
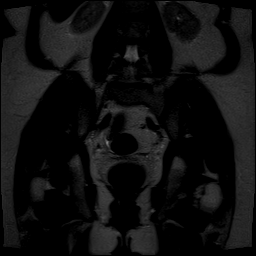
[im 19/28]
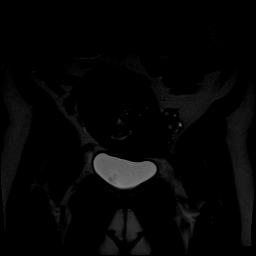
[im 28/28]
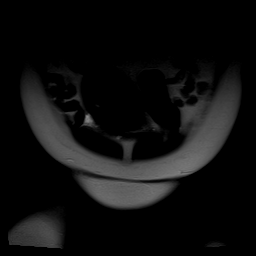

[Series 4: t2_tse axial · axial · 6.0mm · 0.51mm/px · z∈[-17,+185]mm · 5 of 28 slices shown]
[im 1/28]
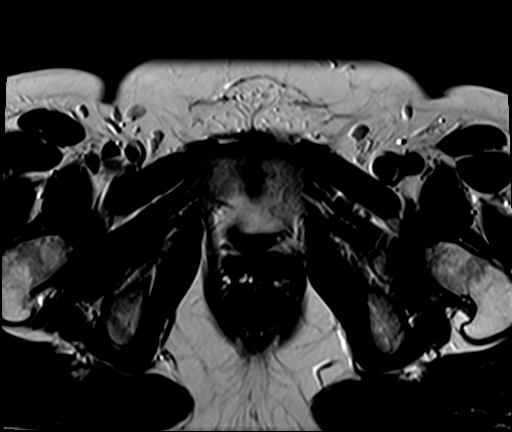
[im 7/28]
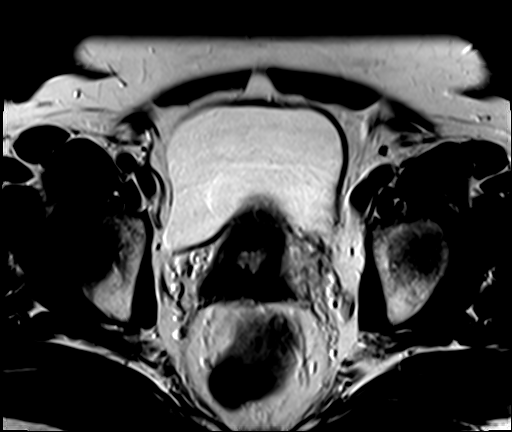
[im 14/28]
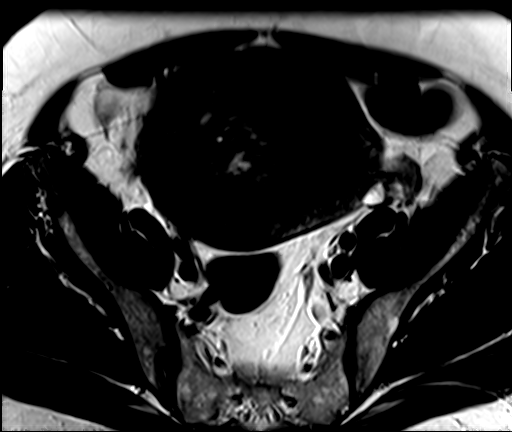
[im 21/28]
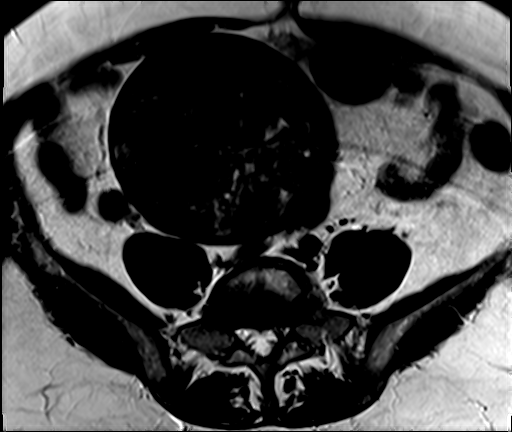
[im 28/28]
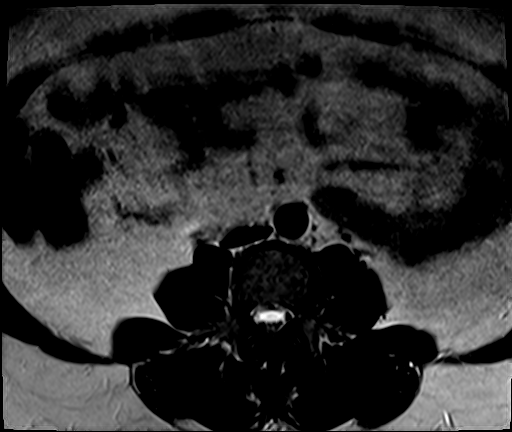

[Series 5: t2_tse axial fs · axial · 6.0mm · 0.51mm/px · z∈[-17,+185]mm · 5 of 28 slices shown]
[im 1/28]
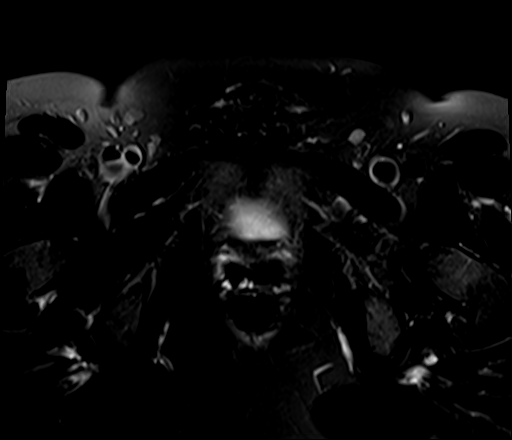
[im 7/28]
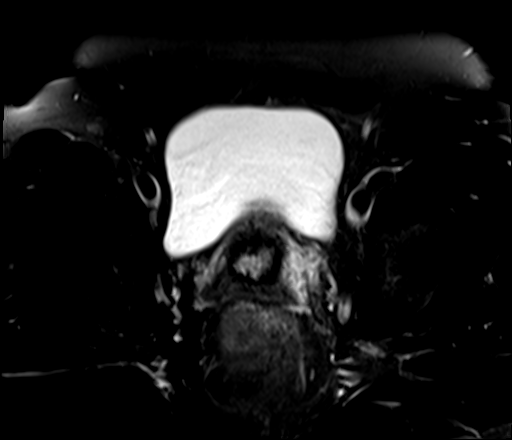
[im 14/28]
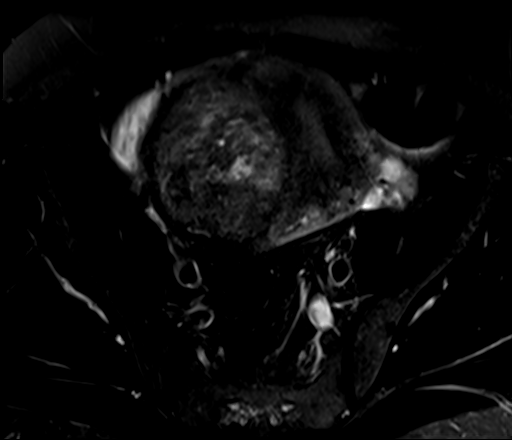
[im 21/28]
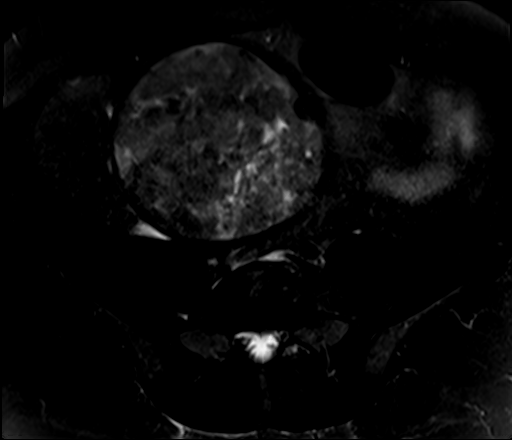
[im 28/28]
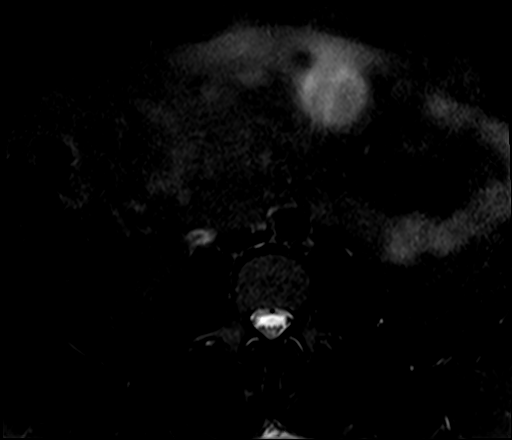

[Series 6: t2_tse_sag · sagittal · 4.0mm · 0.51mm/px · 5 of 32 slices shown]
[im 1/32]
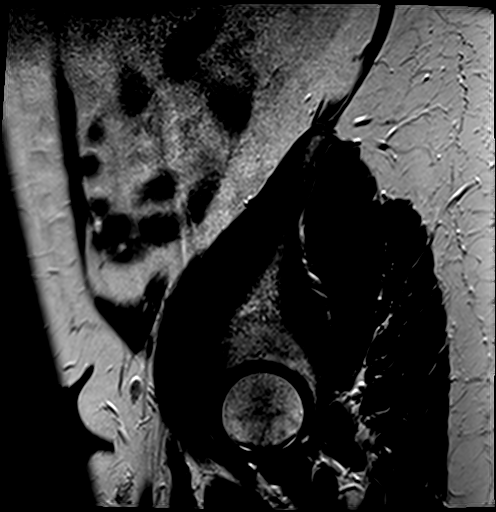
[im 8/32]
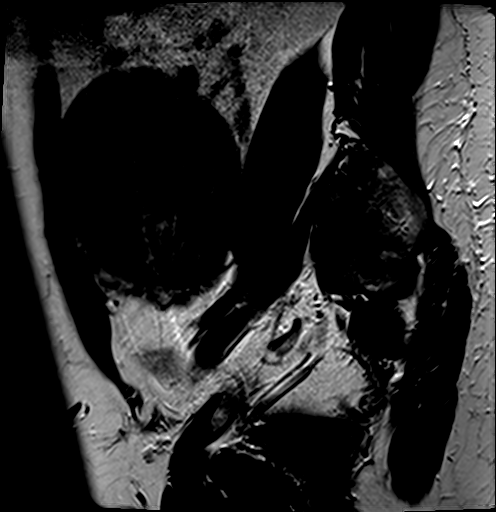
[im 16/32]
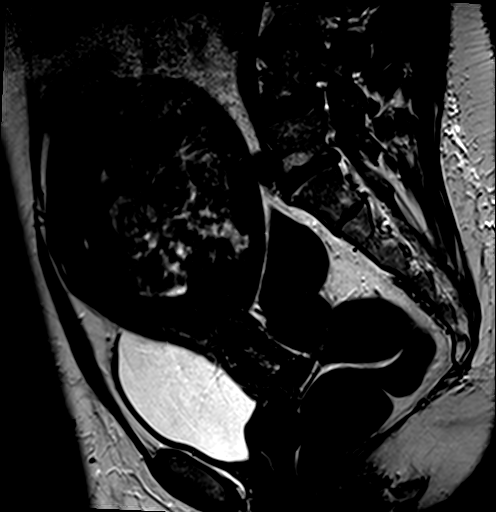
[im 24/32]
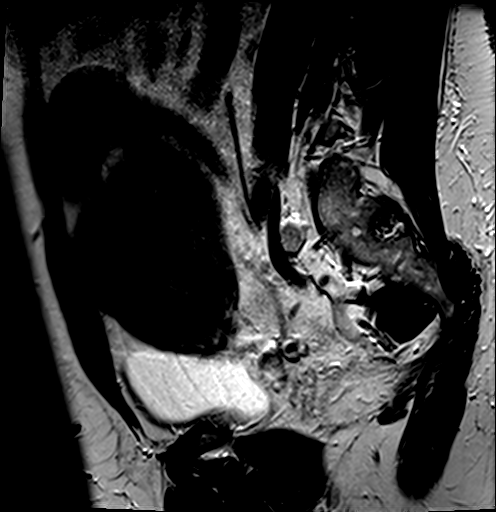
[im 32/32]
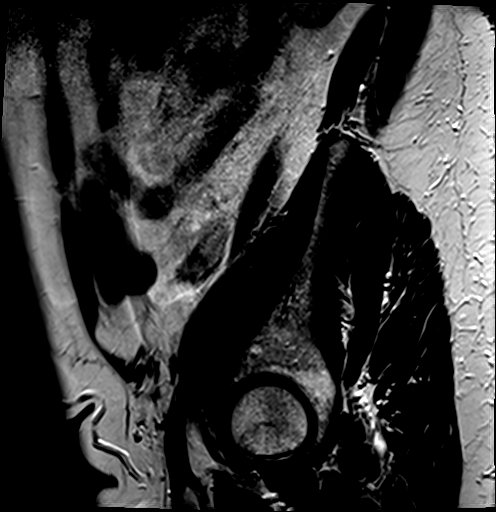

[Series 7: axial spgr-not bh · axial · 6.0mm · 1.02mm/px · z∈[-17,+185]mm · 5 of 28 slices shown]
[im 1/28]
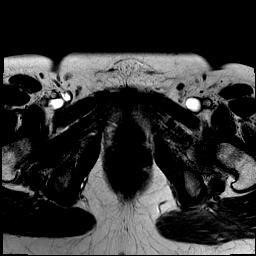
[im 7/28]
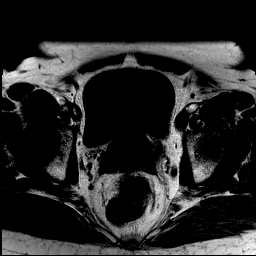
[im 14/28]
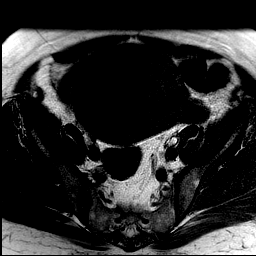
[im 21/28]
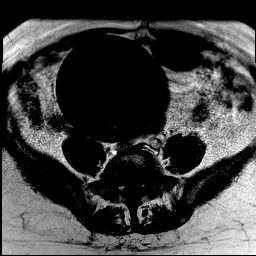
[im 28/28]
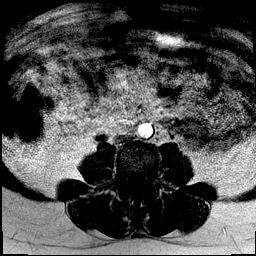

[Series 8: axial spgr fs-pre · axial · non-contrast · 6.0mm · 1.02mm/px · z∈[-17,+185]mm · 5 of 28 slices shown]
[im 1/28]
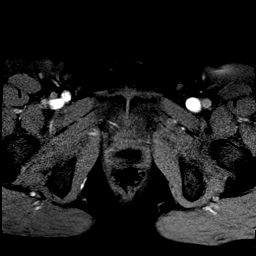
[im 7/28]
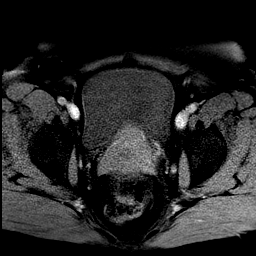
[im 14/28]
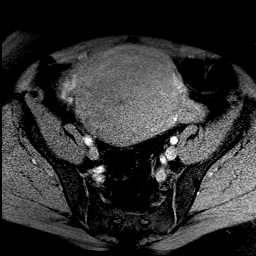
[im 21/28]
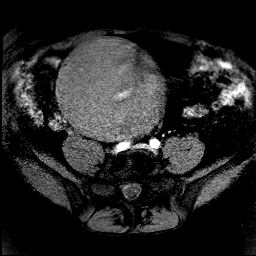
[im 28/28]
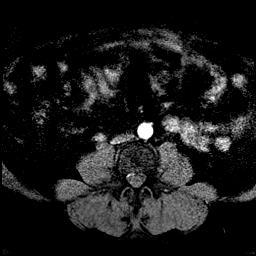

[Series 9: axial spgr fs-post · axial · 6.0mm · 1.02mm/px · z∈[-17,+80]mm · 3 of 28 slices shown]
[im 1/28]
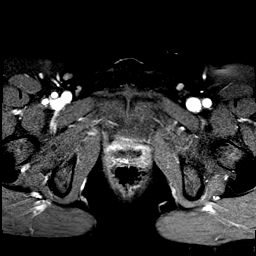
[im 7/28]
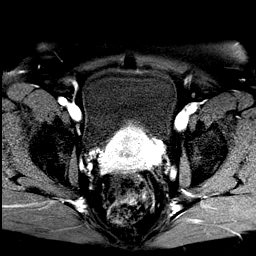
[im 14/28]
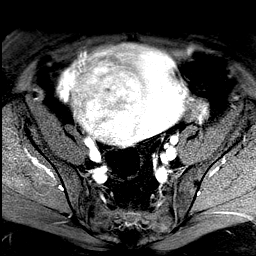

[32 of 48 positions shown; findings below may reference images not displayed]

FINDINGS: BUN and creatinine were obtained on site at [HOSPITAL] at

[HOSPITAL].

Results:  BUN 13 mg/dL,  Creatinine 1.0 mg/dL.

The uterus is markedly enlarged secondary to fibroid change.
Including the fibroids , the uterus measures 18.4 cm in craniocaudal
length by a 11.2 cm in AP diameter by 12.4 cm in coronal dimension.
This uterine enlargement is effectively due to the presence of a
single dominant intramural right fundal fibroid measuring 11.0 x
10.3 x 13.0 cm. Although the fibroid has heterogeneous signal
intensity on precontrast imaging, the majority of the lesion shows
hypo intense signal intensity on T2 weighted imaging. There is no
dominant or large cystic component to the fibroid. After IV contrast
administration, there is somewhat heterogeneous, but nevertheless
diffuse enhancement of the lesion. No other discrete fibroids are
evident. Specifically, there is no evidence for an exophytic or
pedunculated lesion. No findings to suggest the presence of a
submucosal fibroid.

The right ovary measures 1.4 x 3.1 x 1.4 cm and has normal MR
imaging features. The left ovary measures 2.3 x 3.4 x 1.8 cm and is
also normal. There is no adnexal mass.

No pelvic sidewall lymphadenopathy. Major vascular anatomy of the
pelvic sidewalls shows normal flow signal. The urinary bladder is
normal. Urethra is only visualized on coronal and sagittal
reformations but has normal imaging features. Incidental note of a
tampon within the vaginal cavity.

Slight prominence of the mid right ureter may be related to a degree
of compression due to the uterine fibroid.

No abnormal marrow signal within the visualized bony anatomy.
IMPRESSION: A single dominant right intramural fundal fibroid measures up to 13
cm in diameter. This shows slightly heterogeneous, but diffuse
enhancement after IV contrast administration.

Otherwise unremarkable exam.

## 2019-05-30 ENCOUNTER — Ambulatory Visit: Payer: Self-pay | Attending: Internal Medicine

## 2019-05-30 DIAGNOSIS — Z23 Encounter for immunization: Secondary | ICD-10-CM

## 2019-05-30 NOTE — Progress Notes (Signed)
   Covid-19 Vaccination Clinic  Name:  Natalie Carson    MRN: [id number] DOB: 10-13-1965  05/30/2019  [name] was observed post Covid-19 immunization for 15 minutes without incident. She was provided with Vaccine Information Sheet and instruction to access the V-Safe system.   [name] was instructed to call 911 with any severe reactions post vaccine: Marland Kitchen Difficulty breathing  . Swelling of face and throat  . A fast heartbeat  . A bad rash all over body  . Dizziness and weakness   Immunizations Administered    Name Date Dose VIS Date Route   Pfizer COVID-19 Vaccine 05/30/2019  9:34 AM 0.3 mL 04/03/2018 Intramuscular   Manufacturer: [place]   Lot: [id number]   [place]: [id number]

## 2019-06-25 ENCOUNTER — Other Ambulatory Visit: Payer: Self-pay

## 2019-06-25 ENCOUNTER — Ambulatory Visit: Payer: Self-pay | Attending: Internal Medicine

## 2019-06-25 DIAGNOSIS — Z23 Encounter for immunization: Secondary | ICD-10-CM

## 2019-06-25 NOTE — Progress Notes (Signed)
   Covid-19 Vaccination Clinic  Name:  Natalie Carson    MRN: [id number] DOB: [birth year]  06/25/2019  [name] was observed post Covid-19 immunization for 15 minutes without incident. She was provided with Vaccine Information Sheet and instruction to access the V-Safe system.   [name] was instructed to call 911 with any severe reactions post vaccine: Marland Kitchen Difficulty breathing  . Swelling of face and throat  . A fast heartbeat  . A bad rash all over body  . Dizziness and weakness   Immunizations Administered    Name Date Dose VIS Date Route   Pfizer COVID-19 Vaccine 06/25/2019  9:28 AM 0.3 mL 04/03/2018 Intramuscular   Manufacturer: [place]   Lot: [id number]   [place]: [id number]

## 2022-04-28 ENCOUNTER — Other Ambulatory Visit: Payer: Self-pay | Admitting: Obstetrics and Gynecology

## 2022-04-28 DIAGNOSIS — Z1231 Encounter for screening mammogram for malignant neoplasm of breast: Secondary | ICD-10-CM
